# Patient Record
Sex: Female | Born: 1955 | Race: Black or African American | Hispanic: No | Marital: Single
Health system: Southern US, Community
[De-identification: ages and names within clinical notes are randomized; demographics above are authoritative.]

---

## 1998-09-22 ENCOUNTER — Encounter: Payer: Self-pay | Admitting: Internal Medicine

## 1998-09-22 ENCOUNTER — Emergency Department (HOSPITAL_COMMUNITY): Admission: EM | Admit: 1998-09-22 | Discharge: 1998-09-23 | Payer: Self-pay | Admitting: Internal Medicine

## 1998-09-30 ENCOUNTER — Encounter: Admission: RE | Admit: 1998-09-30 | Discharge: 1998-10-30 | Payer: Self-pay | Admitting: Family Medicine

## 1999-01-13 ENCOUNTER — Ambulatory Visit (HOSPITAL_COMMUNITY): Admission: RE | Admit: 1999-01-13 | Discharge: 1999-01-13 | Payer: Self-pay | Admitting: Gastroenterology

## 1999-02-14 ENCOUNTER — Ambulatory Visit (HOSPITAL_COMMUNITY): Admission: RE | Admit: 1999-02-14 | Discharge: 1999-02-14 | Payer: Self-pay | Admitting: Gastroenterology

## 1999-02-14 ENCOUNTER — Encounter: Payer: Self-pay | Admitting: Gastroenterology

## 1999-04-10 ENCOUNTER — Ambulatory Visit (HOSPITAL_COMMUNITY): Admission: RE | Admit: 1999-04-10 | Discharge: 1999-04-10 | Payer: Self-pay | Admitting: Family Medicine

## 1999-12-25 ENCOUNTER — Encounter: Admission: RE | Admit: 1999-12-25 | Discharge: 1999-12-25 | Payer: Self-pay | Admitting: Orthopedic Surgery

## 1999-12-25 ENCOUNTER — Encounter: Payer: Self-pay | Admitting: Orthopedic Surgery

## 1999-12-26 ENCOUNTER — Ambulatory Visit (HOSPITAL_BASED_OUTPATIENT_CLINIC_OR_DEPARTMENT_OTHER): Admission: RE | Admit: 1999-12-26 | Discharge: 1999-12-26 | Payer: Self-pay | Admitting: Orthopedic Surgery

## 2000-02-03 ENCOUNTER — Encounter: Admission: RE | Admit: 2000-02-03 | Discharge: 2000-04-06 | Payer: Self-pay | Admitting: Podiatry

## 2000-02-18 ENCOUNTER — Other Ambulatory Visit: Admission: RE | Admit: 2000-02-18 | Discharge: 2000-02-18 | Payer: Self-pay | Admitting: *Deleted

## 2000-04-12 ENCOUNTER — Encounter: Payer: Self-pay | Admitting: Family Medicine

## 2000-04-12 ENCOUNTER — Ambulatory Visit (HOSPITAL_COMMUNITY): Admission: RE | Admit: 2000-04-12 | Discharge: 2000-04-12 | Payer: Self-pay | Admitting: Family Medicine

## 2000-11-22 ENCOUNTER — Encounter: Payer: Self-pay | Admitting: Gastroenterology

## 2000-11-22 ENCOUNTER — Encounter: Admission: RE | Admit: 2000-11-22 | Discharge: 2000-11-22 | Payer: Self-pay | Admitting: Gastroenterology

## 2000-12-27 ENCOUNTER — Encounter: Payer: Self-pay | Admitting: Family Medicine

## 2000-12-27 ENCOUNTER — Ambulatory Visit (HOSPITAL_COMMUNITY): Admission: RE | Admit: 2000-12-27 | Discharge: 2000-12-27 | Payer: Self-pay | Admitting: Family Medicine

## 2001-04-04 ENCOUNTER — Encounter: Payer: Self-pay | Admitting: Obstetrics and Gynecology

## 2001-04-04 ENCOUNTER — Encounter: Admission: RE | Admit: 2001-04-04 | Discharge: 2001-04-04 | Payer: Self-pay | Admitting: Obstetrics and Gynecology

## 2001-09-06 ENCOUNTER — Encounter: Admission: RE | Admit: 2001-09-06 | Discharge: 2001-09-06 | Payer: Self-pay | Admitting: Gastroenterology

## 2001-09-06 ENCOUNTER — Encounter: Payer: Self-pay | Admitting: Gastroenterology

## 2001-12-05 ENCOUNTER — Encounter: Admission: RE | Admit: 2001-12-05 | Discharge: 2001-12-05 | Payer: Self-pay | Admitting: Gastroenterology

## 2001-12-05 ENCOUNTER — Encounter: Payer: Self-pay | Admitting: Gastroenterology

## 2002-05-04 ENCOUNTER — Encounter: Payer: Self-pay | Admitting: Family Medicine

## 2002-05-04 ENCOUNTER — Ambulatory Visit (HOSPITAL_COMMUNITY): Admission: RE | Admit: 2002-05-04 | Discharge: 2002-05-04 | Payer: Self-pay | Admitting: Family Medicine

## 2002-05-22 ENCOUNTER — Other Ambulatory Visit: Admission: RE | Admit: 2002-05-22 | Discharge: 2002-05-22 | Payer: Self-pay | Admitting: Obstetrics and Gynecology

## 2002-06-12 ENCOUNTER — Encounter: Admission: RE | Admit: 2002-06-12 | Discharge: 2002-06-12 | Payer: Self-pay | Admitting: Gastroenterology

## 2002-06-12 ENCOUNTER — Encounter: Payer: Self-pay | Admitting: Gastroenterology

## 2002-09-09 ENCOUNTER — Emergency Department (HOSPITAL_COMMUNITY): Admission: EM | Admit: 2002-09-09 | Discharge: 2002-09-09 | Payer: Self-pay | Admitting: Emergency Medicine

## 2002-11-16 ENCOUNTER — Ambulatory Visit (HOSPITAL_COMMUNITY): Admission: RE | Admit: 2002-11-16 | Discharge: 2002-11-16 | Payer: Self-pay | Admitting: Gastroenterology

## 2003-05-08 ENCOUNTER — Encounter: Payer: Self-pay | Admitting: Obstetrics and Gynecology

## 2003-05-08 ENCOUNTER — Ambulatory Visit (HOSPITAL_COMMUNITY): Admission: RE | Admit: 2003-05-08 | Discharge: 2003-05-08 | Payer: Self-pay | Admitting: Obstetrics and Gynecology

## 2003-05-28 ENCOUNTER — Other Ambulatory Visit: Admission: RE | Admit: 2003-05-28 | Discharge: 2003-05-28 | Payer: Self-pay | Admitting: Obstetrics and Gynecology

## 2003-07-10 ENCOUNTER — Inpatient Hospital Stay (HOSPITAL_COMMUNITY): Admission: AD | Admit: 2003-07-10 | Discharge: 2003-07-12 | Payer: Self-pay | Admitting: Obstetrics and Gynecology

## 2004-02-26 ENCOUNTER — Other Ambulatory Visit: Admission: RE | Admit: 2004-02-26 | Discharge: 2004-02-26 | Payer: Self-pay | Admitting: *Deleted

## 2004-11-19 ENCOUNTER — Ambulatory Visit (HOSPITAL_COMMUNITY): Admission: RE | Admit: 2004-11-19 | Discharge: 2004-11-19 | Payer: Self-pay | Admitting: *Deleted

## 2004-11-21 ENCOUNTER — Encounter: Admission: RE | Admit: 2004-11-21 | Discharge: 2004-11-21 | Payer: Self-pay | Admitting: Family Medicine

## 2005-05-12 ENCOUNTER — Ambulatory Visit: Payer: Self-pay | Admitting: Internal Medicine

## 2005-06-09 ENCOUNTER — Ambulatory Visit: Payer: Self-pay | Admitting: Internal Medicine

## 2005-06-23 ENCOUNTER — Ambulatory Visit: Payer: Self-pay | Admitting: Internal Medicine

## 2005-06-24 ENCOUNTER — Ambulatory Visit: Payer: Self-pay | Admitting: *Deleted

## 2005-09-01 ENCOUNTER — Other Ambulatory Visit: Admission: RE | Admit: 2005-09-01 | Discharge: 2005-09-01 | Payer: Self-pay | Admitting: *Deleted

## 2006-02-17 ENCOUNTER — Encounter: Payer: Self-pay | Admitting: Family Medicine

## 2006-04-08 IMAGING — CR DG HAND COMPLETE 3+V*L*
3 series · 3 of 3 positions shown · non-contrast
Comparison: none

CLINICAL DATA: Joint pain for several months. 
 RIGHT KNEE ? TWO VIEW:
 Two views of the right knee show no acute abnormality.  The joint spaces appear normal and no effusion is seen.

[view not recorded (1 of 3)]
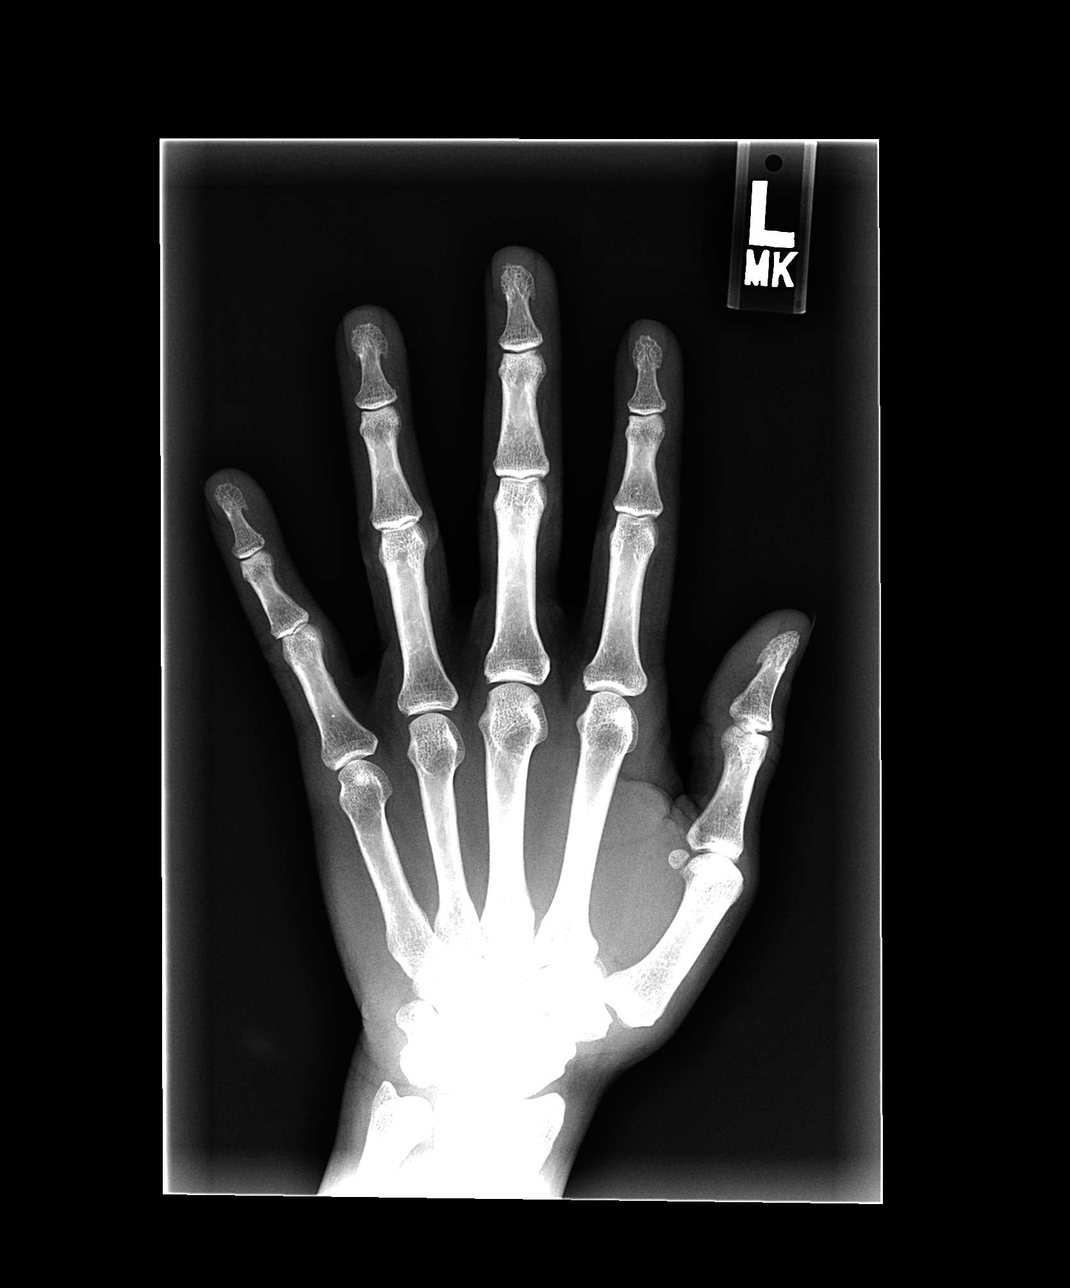

[view not recorded (2 of 3)]
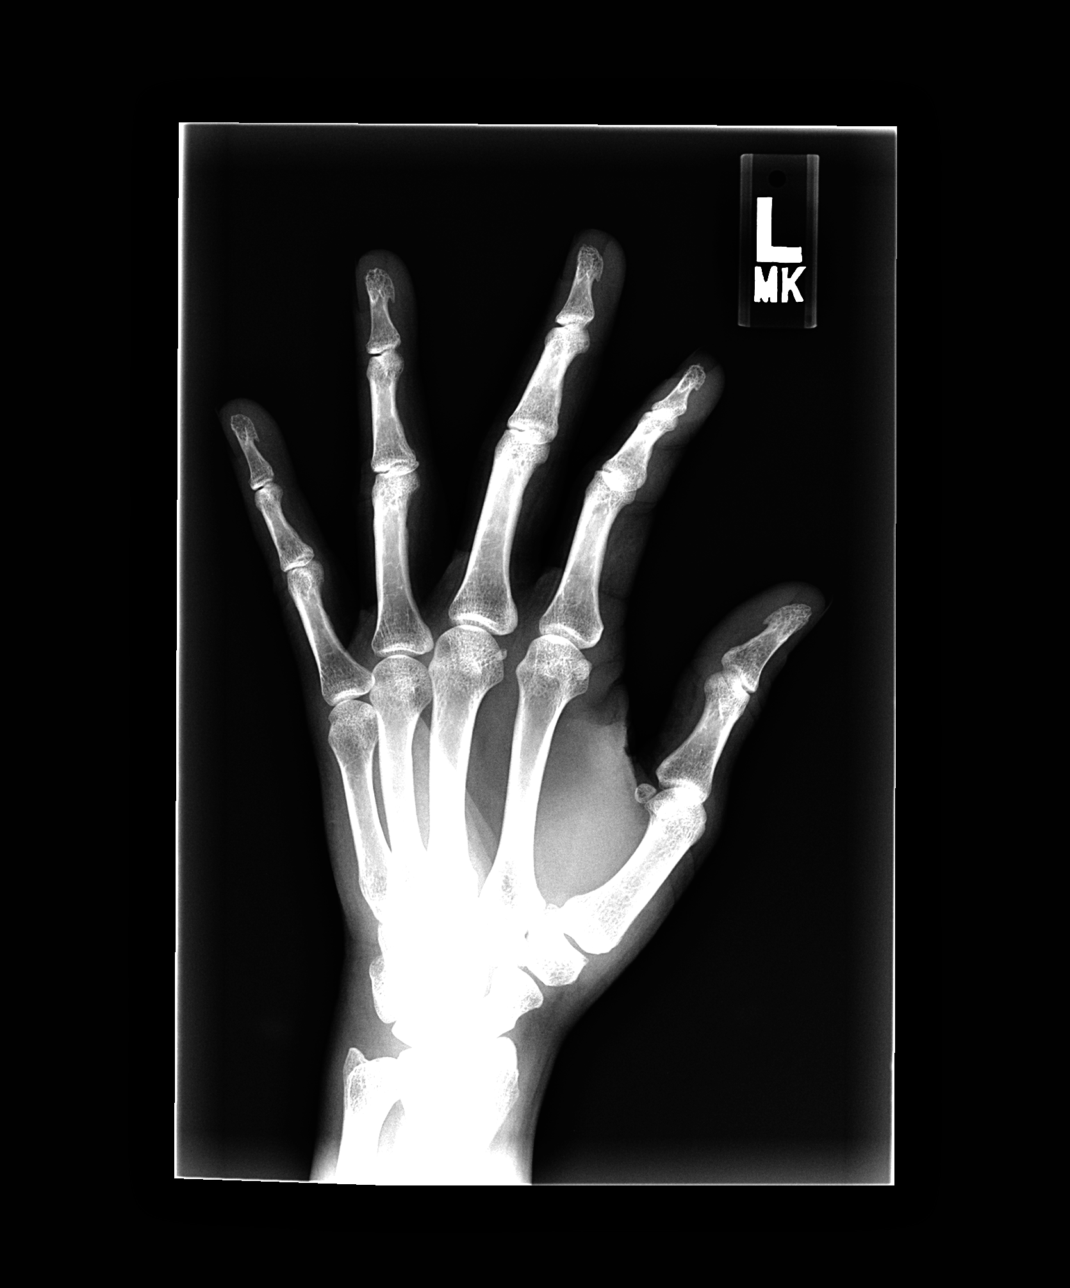

[view not recorded (3 of 3)]
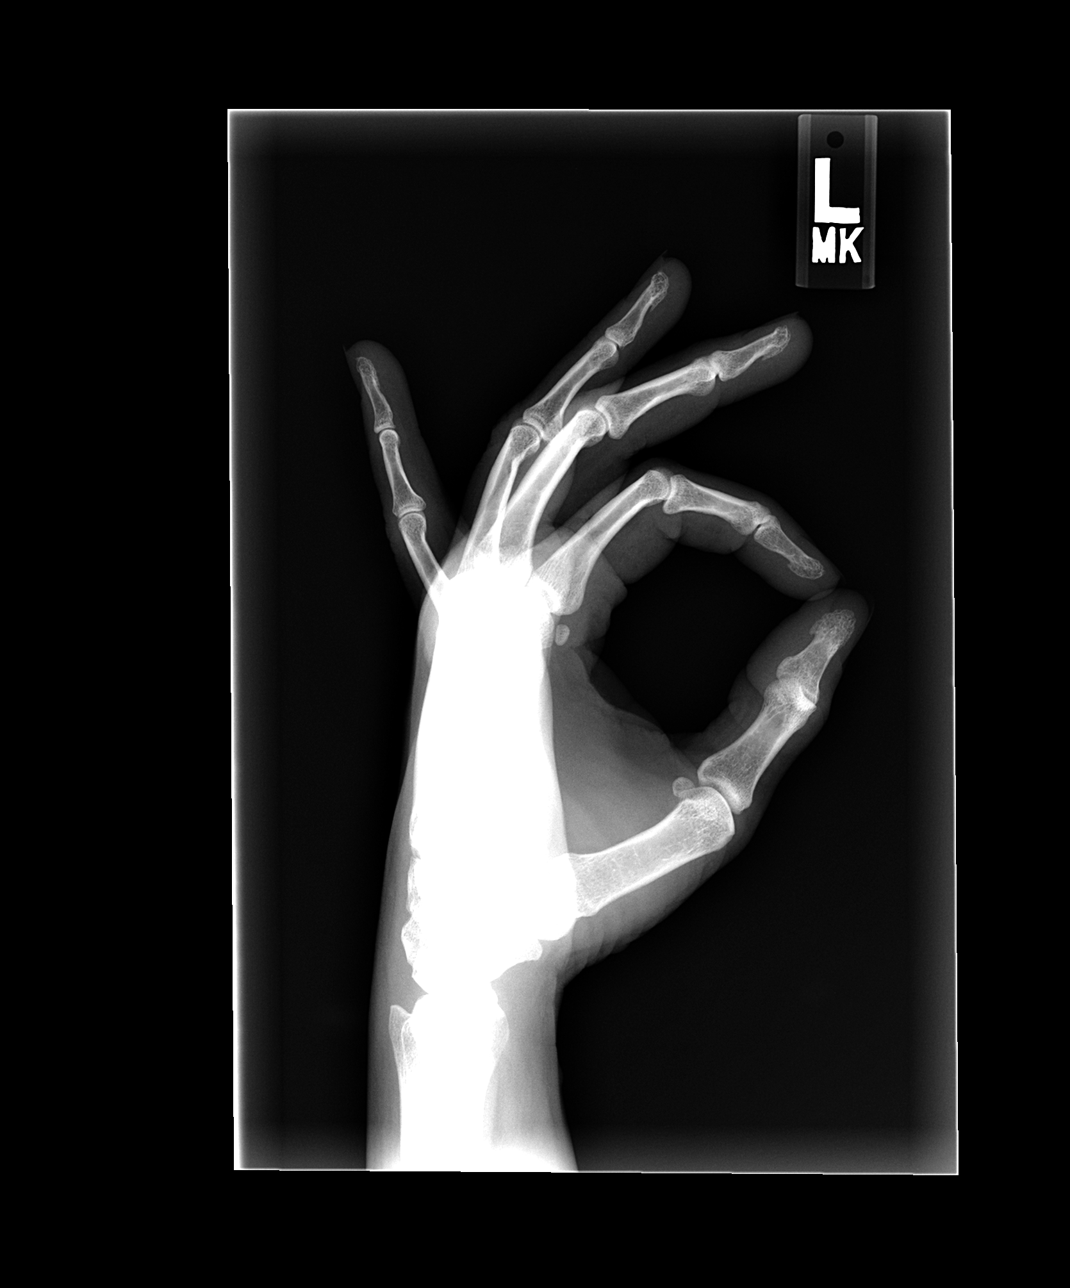

[3 of 3 positions shown; findings below may reference images not displayed]

IMPRESSION: Negative right knee. 
 LEFT HAND ? THREE VIEW:
 No acute abnormality is seen.  The joint spaces are well preserved and no erosions are noted.
IMPRESSION: Negative left hand. 
 LEFT WRIST ? FOUR VIEW:
 Four views of the left wrist show no acute abnormality.  The radiocarpal joint space is normal and the carpal bones are in normal position.  No erosions are seen.
IMPRESSION: Negative left wrist. 
 RIGHT WRIST ? FOUR VIEW:
 Four views of the right wrist show no acute abnormality.  The radiocarpal joint space appears normal and the carpal bones are in normal position.
IMPRESSION: Negative right wrist. 
 CERVICAL SPINE ? FIVE VIEW:
 Five views of the cervical spine show the cervical vertebrae to be in normal alignment with normal intervertebral disc spaces.  No prevertebral soft tissue swelling is seen.  On oblique views the foramina are patent. The odontoid process is intact.
IMPRESSION: Negative cervical spine.  Normal alignment.

## 2006-04-08 IMAGING — CR DG KNEE 1-2V*R*
2 series · 2 of 2 positions shown · non-contrast
Comparison: none

CLINICAL DATA: Joint pain for several months. 
 RIGHT KNEE ? TWO VIEW:
 Two views of the right knee show no acute abnormality.  The joint spaces appear normal and no effusion is seen.

[view not recorded (1 of 2)]
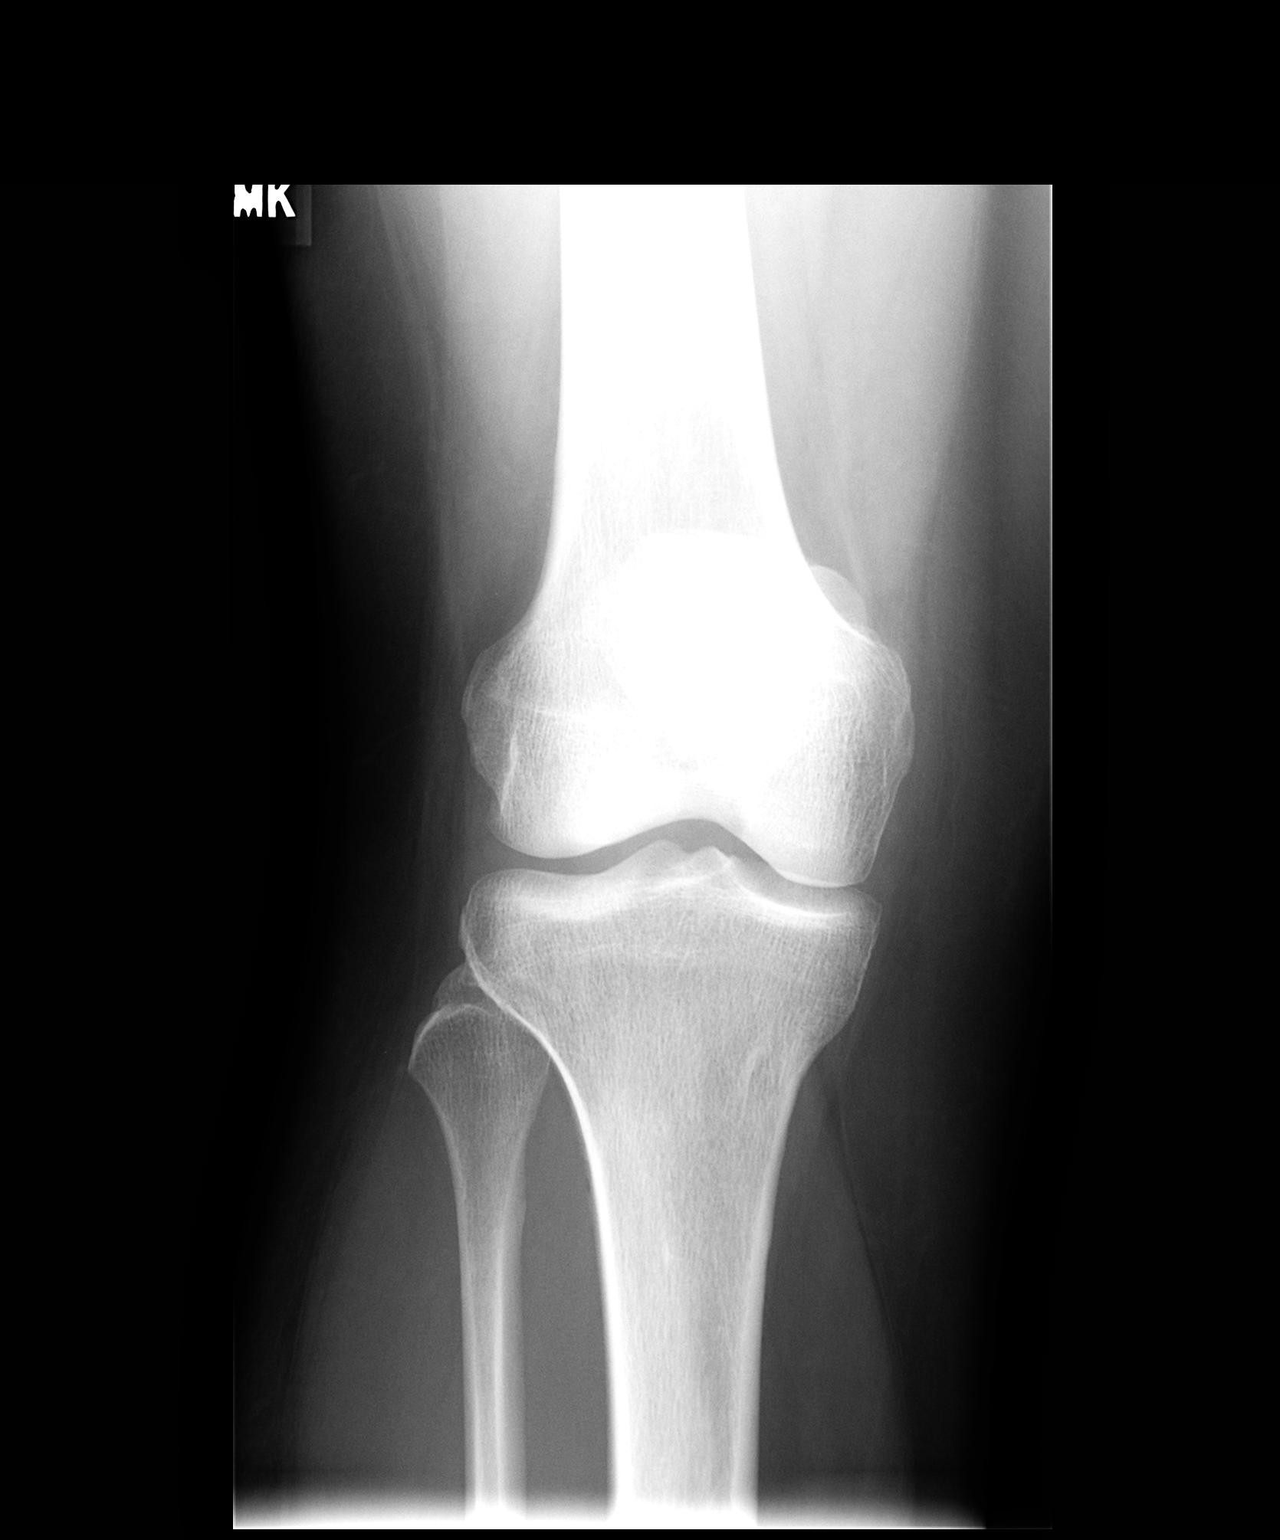

[view not recorded (2 of 2)]
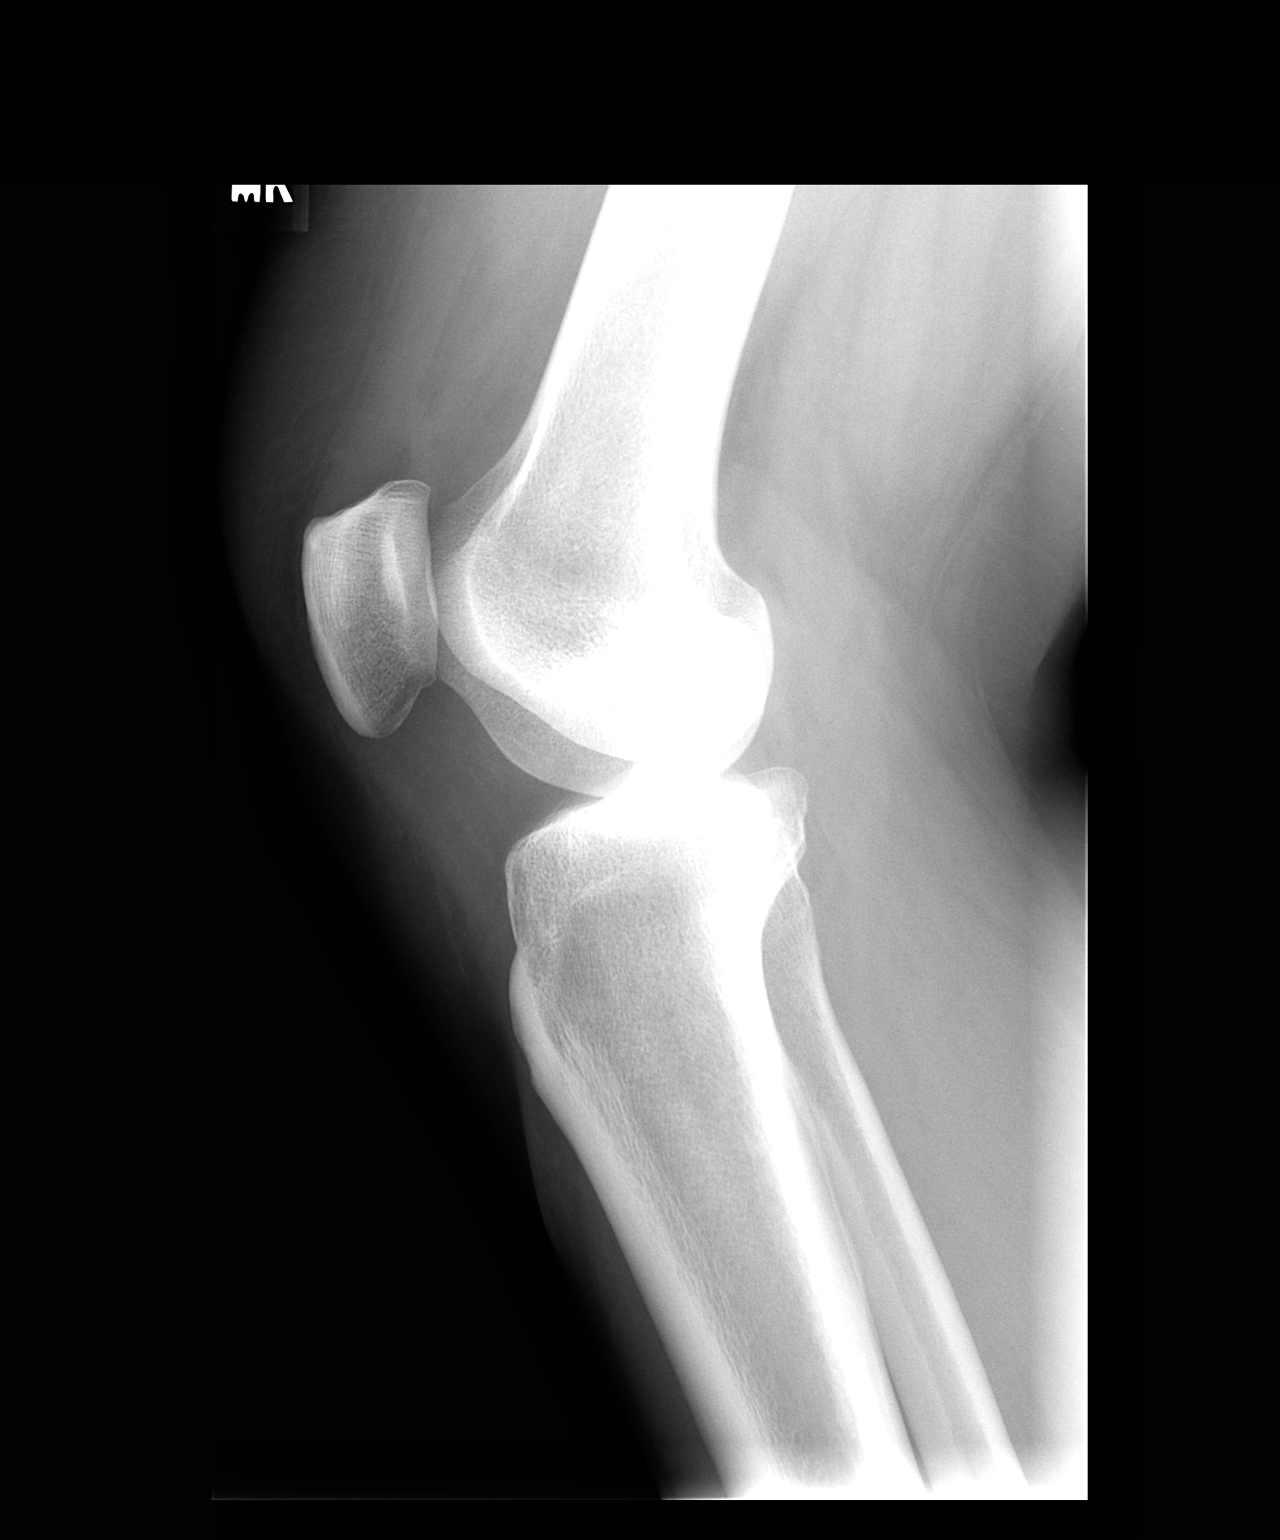

[2 of 2 positions shown; findings below may reference images not displayed]

IMPRESSION: Negative right knee. 
 LEFT HAND ? THREE VIEW:
 No acute abnormality is seen.  The joint spaces are well preserved and no erosions are noted.
IMPRESSION: Negative left hand. 
 LEFT WRIST ? FOUR VIEW:
 Four views of the left wrist show no acute abnormality.  The radiocarpal joint space is normal and the carpal bones are in normal position.  No erosions are seen.
IMPRESSION: Negative left wrist. 
 RIGHT WRIST ? FOUR VIEW:
 Four views of the right wrist show no acute abnormality.  The radiocarpal joint space appears normal and the carpal bones are in normal position.
IMPRESSION: Negative right wrist. 
 CERVICAL SPINE ? FIVE VIEW:
 Five views of the cervical spine show the cervical vertebrae to be in normal alignment with normal intervertebral disc spaces.  No prevertebral soft tissue swelling is seen.  On oblique views the foramina are patent. The odontoid process is intact.
IMPRESSION: Negative cervical spine.  Normal alignment.

## 2006-08-09 ENCOUNTER — Other Ambulatory Visit: Admission: RE | Admit: 2006-08-09 | Discharge: 2006-08-09 | Payer: Self-pay | Admitting: *Deleted

## 2016-06-01 ENCOUNTER — Other Ambulatory Visit: Payer: Self-pay | Admitting: Family Medicine

## 2016-06-01 DIAGNOSIS — M542 Cervicalgia: Secondary | ICD-10-CM

## 2016-06-03 ENCOUNTER — Ambulatory Visit (INDEPENDENT_AMBULATORY_CARE_PROVIDER_SITE_OTHER): Payer: Federal, State, Local not specified - PPO | Admitting: Podiatry

## 2016-06-03 ENCOUNTER — Ambulatory Visit (INDEPENDENT_AMBULATORY_CARE_PROVIDER_SITE_OTHER): Payer: Federal, State, Local not specified - PPO

## 2016-06-03 ENCOUNTER — Other Ambulatory Visit: Payer: Self-pay | Admitting: Family Medicine

## 2016-06-03 DIAGNOSIS — R52 Pain, unspecified: Secondary | ICD-10-CM

## 2016-06-03 DIAGNOSIS — M722 Plantar fascial fibromatosis: Secondary | ICD-10-CM

## 2016-06-03 DIAGNOSIS — S90129A Contusion of unspecified lesser toe(s) without damage to nail, initial encounter: Secondary | ICD-10-CM | POA: Diagnosis not present

## 2016-06-03 DIAGNOSIS — M542 Cervicalgia: Secondary | ICD-10-CM

## 2016-06-03 MED ORDER — MELOXICAM 15 MG PO TABS: 15.0000 mg | ORAL_TABLET | Freq: Every day | ORAL | 2 refills | Status: AC

## 2016-06-03 NOTE — Progress Notes (Signed)
Subjective:    Patient ID: Monica Mooney, female    DOB: 04/13/1956, 60 y.o.   MRN: [id number]  HPI  60 year old female presents the also concerns of bilateral foot pain. She points to the arches as well as the bottoms of the heels where she is majority of pain. This has been ongoing for several weeks. The pain is worse than when she first gets up or after sitting for some time. The pain is somewhat relieved with ambulation was started become more consistent. She denies any swelling or redness or any numbness or tingling. She does state that she hit her right fifth toe on a door and she's had some pain and swelling to the fifth toe. This has improved. No recent treatment. No other complaints at this time.  Review of Systems  All other systems reviewed and are negative.      Objective:   Physical Exam General: AAO x3, NAD  Dermatological: Skin is warm, dry and supple bilateral. Nails x 10 are well manicured; remaining integument appears unremarkable at this time. There are no open sores, no preulcerative lesions, no rash or signs of infection present.  Vascular: Dorsalis Pedis artery and Posterior Tibial artery pedal pulses are 2/4 bilateral with immedate capillary fill time. Pedal hair growth present. There is no pain with calf compression, swelling, warmth, erythema.   Neruologic: Grossly intact via light touch bilateral. Vibratory intact via tuning fork bilateral. Protective threshold with Semmes Wienstein monofilament intact to all pedal sites bilateral.   Musculoskeletal: Tenderness to palpation along the plantar medial tubercle of the calcaneus at the insertion of plantar fascia on the left and right foot. There is mild pain along the course of the plantar fascia within the arch of the foot along the medial band of the fascia. Plantar fascia appears to be intact. There is no pain with lateral compression of the calcaneus or pain with vibratory sensation. There is no pain along the course  or insertion of the achilles tendon. Mild discomfort of the right fifth toe. on the metatarsal other areas of pinpoint bony tenderness. There is mild edema to the right fifth toe. No overlying erythema or increase in warmth. No skin breakdown. No other areas of tenderness to bilateral lower extremities.   Gait: Unassisted, Nonantalgic.      Assessment & Plan:  Bilateral heel pain, likely plantar fasciitis; right fifth toe contusion -Treatment options discussed including all alternatives, risks, and complications -Etiology of symptoms were discussed -X-rays were obtained and reviewed with the patient. No evidence of acute fracture. As prior surgery. -Patient elects to proceed with steroid injection into the left and right heel. Under sterile skin preparation, a total of 2.5cc of kenalog 10, 0.5% Marcaine plain, and 2% lidocaine plain were infiltrated into the symptomatic area without complication. A band-aid was applied. Patient tolerated the injection well without complication. Post-injection care with discussed with the patient. Discussed with the patient to ice the area over the next couple of days to help prevent a steroid flare.  -Plantar fascial braces dispensed -Stretching, icing exercises daily. -Prescribed mobic. Discussed side effects of the medication and directed to stop if any are to occur and call the office.  -Discussed shoe gear modifications and orthotics -Tape the fifth toe to help with swelling as well as for splinting. -Follow-up in 3 weeks or sooner if any problems arise. In the meantime, encouraged to call the office with any questions, concerns, change in symptoms.   Ovid CurdMatthew Wagoner, DPM

## 2016-06-03 NOTE — Patient Instructions (Signed)

## 2016-06-04 ENCOUNTER — Other Ambulatory Visit: Payer: Self-pay

## 2016-06-05 ENCOUNTER — Ambulatory Visit
Admission: RE | Admit: 2016-06-05 | Discharge: 2016-06-05 | Disposition: A | Discharge status: Home / Self Care | Payer: Federal, State, Local not specified - PPO | Source: Ambulatory Visit | Attending: Family Medicine | Admitting: Family Medicine

## 2016-06-05 DIAGNOSIS — M542 Cervicalgia: Secondary | ICD-10-CM

## 2016-06-05 DIAGNOSIS — M722 Plantar fascial fibromatosis: Secondary | ICD-10-CM | POA: Insufficient documentation

## 2018-04-06 DIAGNOSIS — K080 Exfoliation of teeth due to systemic causes: Secondary | ICD-10-CM | POA: Diagnosis not present

## 2018-05-25 DIAGNOSIS — L42 Pityriasis rosea: Secondary | ICD-10-CM | POA: Diagnosis not present

## 2018-05-25 DIAGNOSIS — Z6835 Body mass index (BMI) 35.0-35.9, adult: Secondary | ICD-10-CM | POA: Diagnosis not present

## 2018-05-25 DIAGNOSIS — E785 Hyperlipidemia, unspecified: Secondary | ICD-10-CM | POA: Diagnosis not present

## 2018-06-16 DIAGNOSIS — L309 Dermatitis, unspecified: Secondary | ICD-10-CM | POA: Diagnosis not present

## 2018-10-01 DIAGNOSIS — E785 Hyperlipidemia, unspecified: Secondary | ICD-10-CM | POA: Diagnosis not present

## 2018-10-01 DIAGNOSIS — L42 Pityriasis rosea: Secondary | ICD-10-CM | POA: Diagnosis not present

## 2018-10-01 DIAGNOSIS — R21 Rash and other nonspecific skin eruption: Secondary | ICD-10-CM | POA: Diagnosis not present

## 2018-10-01 DIAGNOSIS — M25532 Pain in left wrist: Secondary | ICD-10-CM | POA: Diagnosis not present

## 2018-10-01 DIAGNOSIS — Z6835 Body mass index (BMI) 35.0-35.9, adult: Secondary | ICD-10-CM | POA: Diagnosis not present

## 2019-05-24 DIAGNOSIS — S61002A Unspecified open wound of left thumb without damage to nail, initial encounter: Secondary | ICD-10-CM | POA: Diagnosis not present

## 2019-05-29 DIAGNOSIS — Z1231 Encounter for screening mammogram for malignant neoplasm of breast: Secondary | ICD-10-CM | POA: Diagnosis not present

## 2019-07-06 DIAGNOSIS — Z800 Family history of malignant neoplasm of digestive organs: Secondary | ICD-10-CM | POA: Diagnosis not present

## 2019-08-08 DIAGNOSIS — M9901 Segmental and somatic dysfunction of cervical region: Secondary | ICD-10-CM | POA: Diagnosis not present

## 2019-08-08 DIAGNOSIS — M5033 Other cervical disc degeneration, cervicothoracic region: Secondary | ICD-10-CM | POA: Diagnosis not present

## 2019-08-08 DIAGNOSIS — M5013 Cervical disc disorder with radiculopathy, cervicothoracic region: Secondary | ICD-10-CM | POA: Diagnosis not present

## 2019-08-08 DIAGNOSIS — M25512 Pain in left shoulder: Secondary | ICD-10-CM | POA: Diagnosis not present

## 2019-08-26 DIAGNOSIS — M542 Cervicalgia: Secondary | ICD-10-CM | POA: Diagnosis not present

## 2019-08-26 DIAGNOSIS — E785 Hyperlipidemia, unspecified: Secondary | ICD-10-CM | POA: Diagnosis not present

## 2019-08-26 DIAGNOSIS — L42 Pityriasis rosea: Secondary | ICD-10-CM | POA: Diagnosis not present

## 2019-08-26 DIAGNOSIS — M25532 Pain in left wrist: Secondary | ICD-10-CM | POA: Diagnosis not present

## 2019-08-29 DIAGNOSIS — J019 Acute sinusitis, unspecified: Secondary | ICD-10-CM | POA: Diagnosis not present

## 2019-08-29 DIAGNOSIS — Z20828 Contact with and (suspected) exposure to other viral communicable diseases: Secondary | ICD-10-CM | POA: Diagnosis not present

## 2019-08-29 DIAGNOSIS — Z1159 Encounter for screening for other viral diseases: Secondary | ICD-10-CM | POA: Diagnosis not present

## 2019-12-23 DIAGNOSIS — M79603 Pain in arm, unspecified: Secondary | ICD-10-CM | POA: Diagnosis not present

## 2019-12-23 DIAGNOSIS — M79632 Pain in left forearm: Secondary | ICD-10-CM | POA: Diagnosis not present

## 2019-12-23 DIAGNOSIS — S46212A Strain of muscle, fascia and tendon of other parts of biceps, left arm, initial encounter: Secondary | ICD-10-CM | POA: Diagnosis not present

## 2019-12-23 DIAGNOSIS — X509XXA Other and unspecified overexertion or strenuous movements or postures, initial encounter: Secondary | ICD-10-CM | POA: Diagnosis not present

## 2019-12-23 DIAGNOSIS — Z79899 Other long term (current) drug therapy: Secondary | ICD-10-CM | POA: Diagnosis not present

## 2019-12-23 DIAGNOSIS — S46122A Laceration of muscle, fascia and tendon of long head of biceps, left arm, initial encounter: Secondary | ICD-10-CM | POA: Diagnosis not present

## 2019-12-23 DIAGNOSIS — Z880 Allergy status to penicillin: Secondary | ICD-10-CM | POA: Diagnosis not present

## 2019-12-26 DIAGNOSIS — M25522 Pain in left elbow: Secondary | ICD-10-CM | POA: Diagnosis not present

## 2020-06-03 DIAGNOSIS — Z1231 Encounter for screening mammogram for malignant neoplasm of breast: Secondary | ICD-10-CM | POA: Diagnosis not present

## 2020-06-25 DIAGNOSIS — M25511 Pain in right shoulder: Secondary | ICD-10-CM | POA: Diagnosis not present

## 2020-06-25 DIAGNOSIS — Z13220 Encounter for screening for lipoid disorders: Secondary | ICD-10-CM | POA: Diagnosis not present

## 2020-06-25 DIAGNOSIS — G8929 Other chronic pain: Secondary | ICD-10-CM | POA: Diagnosis not present

## 2020-06-25 DIAGNOSIS — Z833 Family history of diabetes mellitus: Secondary | ICD-10-CM | POA: Diagnosis not present

## 2020-06-25 DIAGNOSIS — Z6831 Body mass index (BMI) 31.0-31.9, adult: Secondary | ICD-10-CM | POA: Diagnosis not present

## 2020-06-25 DIAGNOSIS — M5412 Radiculopathy, cervical region: Secondary | ICD-10-CM | POA: Diagnosis not present

## 2020-12-25 DIAGNOSIS — R7303 Prediabetes: Secondary | ICD-10-CM | POA: Diagnosis not present

## 2020-12-25 DIAGNOSIS — Z6829 Body mass index (BMI) 29.0-29.9, adult: Secondary | ICD-10-CM | POA: Diagnosis not present

## 2020-12-25 DIAGNOSIS — E7800 Pure hypercholesterolemia, unspecified: Secondary | ICD-10-CM | POA: Diagnosis not present

## 2021-04-04 DIAGNOSIS — R7611 Nonspecific reaction to tuberculin skin test without active tuberculosis: Secondary | ICD-10-CM | POA: Diagnosis not present

## 2021-05-12 DIAGNOSIS — E785 Hyperlipidemia, unspecified: Secondary | ICD-10-CM | POA: Diagnosis not present

## 2021-05-12 DIAGNOSIS — Z0000 Encounter for general adult medical examination without abnormal findings: Secondary | ICD-10-CM | POA: Diagnosis not present

## 2021-05-12 DIAGNOSIS — R7611 Nonspecific reaction to tuberculin skin test without active tuberculosis: Secondary | ICD-10-CM | POA: Diagnosis not present

## 2021-05-20 DIAGNOSIS — Z1231 Encounter for screening mammogram for malignant neoplasm of breast: Secondary | ICD-10-CM | POA: Diagnosis not present

## 2021-05-20 DIAGNOSIS — Z780 Asymptomatic menopausal state: Secondary | ICD-10-CM | POA: Diagnosis not present

## 2021-05-20 DIAGNOSIS — Z13820 Encounter for screening for osteoporosis: Secondary | ICD-10-CM | POA: Diagnosis not present
